# Patient Record
Sex: Male | Born: 1990 | Hispanic: Yes | Marital: Married
Health system: Southern US, Community
[De-identification: ages and names within clinical notes are randomized; demographics above are authoritative.]

---

## 2018-11-08 ENCOUNTER — Encounter (HOSPITAL_BASED_OUTPATIENT_CLINIC_OR_DEPARTMENT_OTHER): Payer: Self-pay | Admitting: Emergency Medicine

## 2018-11-08 ENCOUNTER — Other Ambulatory Visit: Payer: Self-pay

## 2018-11-08 ENCOUNTER — Emergency Department (HOSPITAL_BASED_OUTPATIENT_CLINIC_OR_DEPARTMENT_OTHER): Payer: Self-pay

## 2018-11-08 ENCOUNTER — Emergency Department (HOSPITAL_BASED_OUTPATIENT_CLINIC_OR_DEPARTMENT_OTHER)
Admission: EM | Admit: 2018-11-08 | Discharge: 2018-11-08 | Disposition: A | Discharge status: Home / Self Care | Payer: Self-pay | Attending: Emergency Medicine | Admitting: Emergency Medicine

## 2018-11-08 DIAGNOSIS — S60112A Contusion of left thumb with damage to nail, initial encounter: Secondary | ICD-10-CM | POA: Insufficient documentation

## 2018-11-08 DIAGNOSIS — Y9389 Activity, other specified: Secondary | ICD-10-CM | POA: Insufficient documentation

## 2018-11-08 DIAGNOSIS — Y9259 Other trade areas as the place of occurrence of the external cause: Secondary | ICD-10-CM | POA: Insufficient documentation

## 2018-11-08 DIAGNOSIS — S6010XA Contusion of unspecified finger with damage to nail, initial encounter: Secondary | ICD-10-CM

## 2018-11-08 DIAGNOSIS — Y990 Civilian activity done for income or pay: Secondary | ICD-10-CM | POA: Insufficient documentation

## 2018-11-08 DIAGNOSIS — Z23 Encounter for immunization: Secondary | ICD-10-CM | POA: Insufficient documentation

## 2018-11-08 DIAGNOSIS — W270XXA Contact with workbench tool, initial encounter: Secondary | ICD-10-CM | POA: Insufficient documentation

## 2018-11-08 MED ORDER — TETANUS-DIPHTH-ACELL PERTUSSIS 5-2.5-18.5 LF-MCG/0.5 IM SUSP
0.5000 mL | Freq: Once | INTRAMUSCULAR | Status: AC
  Administered 2018-11-08: 09:00:00 0.5 mL via INTRAMUSCULAR
  Filled 2018-11-08: qty 0.5

## 2018-11-08 MED ORDER — LIDOCAINE HCL 2 % IJ SOLN
10.0000 mL | Freq: Once | INTRAMUSCULAR | Status: AC
  Administered 2018-11-08: 10:00:00 200 mg
  Filled 2018-11-08: qty 20

## 2018-11-08 NOTE — Discharge Instructions (Addendum)
You were evaluated in the Emergency Department and after careful evaluation, we did not find any emergent condition requiring admission or further testing in the hospital.  Your symptoms today seem to be due to a collection of blood under the fingernail, which we drained here in the emergency department.  The hole in the fingernail should continue to drain.  Please use Tylenol or ibuprofen at home for discomfort.  Please return to the Emergency Department if you experience any worsening of your condition.  We encourage you to follow up with a primary care provider.  Thank you for allowing [place] to be a part of your care.

## 2018-11-08 NOTE — ED Provider Notes (Signed)
[facility] Emergency Department Provider Note MRN:  [id number]  Arrival date & time: 11/08/18     Chief Complaint   Finger Injury   History of Present Illness   Carl Ballard is a 28 y.o. year-old male with no pertinent past medical history presenting to the ED with chief complaint of finger injury.  Patient accidentally hit his left thumb with a hammer at work yesterday.  Injury occurred about 5 PM.  Patient has had continued pain to the thumb, which occasionally radiates up the left wrist.  Pain moderate to severe, kept him from sleeping last night.  Denies any other injuries, unsure of last tetanus shot.  Review of Systems  A complete 10 system review of systems was obtained and all systems are negative except as noted in the HPI and PMH.   Patient's Health History   History reviewed. No pertinent past medical history.  History reviewed. No pertinent surgical history.  History reviewed. No pertinent family history.  Social History   Socioeconomic History  . Marital status: Married    Spouse name: Not on file  . Number of children: Not on file  . Years of education: Not on file  . Highest education level: Not on file  Occupational History  . Not on file  Social Needs  . Financial resource strain: Not on file  . Food insecurity    Worry: Not on file    Inability: Not on file  . Transportation needs    Medical: Not on file    Non-medical: Not on file  Tobacco Use  . Smoking status: Unknown If Ever Smoked  Substance and Sexual Activity  . Alcohol use: Yes    Comment: occ  . Drug use: Not Currently  . Sexual activity: Yes  Lifestyle  . Physical activity    Days per week: Not on file    Minutes per session: Not on file  . Stress: Not on file  Relationships  . Social Musicianconnections    Talks on phone: Not on file    Gets together: Not on file    Attends religious service: Not on file    Active member of club or organization: Not on file     Attends meetings of clubs or organizations: Not on file    Relationship status: Not on file  . Intimate partner violence    Fear of current or ex partner: Not on file    Emotionally abused: Not on file    Physically abused: Not on file    Forced sexual activity: Not on file  Other Topics Concern  . Not on file  Social History Narrative  . Not on file     Physical Exam  Vital Signs and Nursing Notes reviewed Vitals:   11/08/18 0915  BP: 133/88  Pulse: 80  Resp: 20  Temp: 98 F (36.7 C)  SpO2: 100%    CONSTITUTIONAL: Well-appearing, NAD NEURO:  Alert and oriented x 3, no focal deficits EYES:  eyes equal and reactive ENT/NECK:  no LAD, no JVD CARDIO: Regular rate, well-perfused, normal S1 and S2 PULM:  CTAB no wheezing or rhonchi GI/GU:  normal bowel sounds, non-distended, non-tender MSK/SPINE:  No gross deformities, no edema SKIN: Subungual hematoma to the left thumb, nailbed intact, tenderness palpation to the distal left thumb, no snuffbox tenderness PSYCH:  Appropriate speech and behavior  Diagnostic and Interventional Summary    Labs Reviewed - No data to display  DG Hand Complete Left  Final Result      Medications  Tdap (BOOSTRIX) injection 0.5 mL (0.5 mLs Intramuscular Given 11/08/18 0929)  lidocaine (XYLOCAINE) 2 % (with pres) injection 200 mg (200 mg Other Given by Other 11/08/18 0932)     .Nerve Block  Date/Time: 11/08/2018 10:36 AM Performed by: Maudie Flakes, MD Authorized by: Maudie Flakes, MD   Consent:    Consent obtained:  Verbal   Consent given by:  Patient   Risks discussed:  Unsuccessful block and pain Indications:    Indications:  Procedural anesthesia Location:    Body area:  Upper extremity   Upper extremity nerve blocked: Left thumb. Pre-procedure details:    Skin preparation:  Alcohol   Preparation: Patient was prepped and draped in usual sterile fashion   Procedure details (see MAR for exact dosages):    Block needle gauge:   25 G   Anesthetic injected:  Lidocaine 2% w/o epi   Injection procedure:  Anatomic landmarks identified   Paresthesia:  None Post-procedure details:    Dressing:  None   Outcome:  Anesthesia achieved   Patient tolerance of procedure:  Tolerated well, no immediate complications .Marland KitchenIncision and Drainage  Date/Time: 11/08/2018 10:37 AM Performed by: Maudie Flakes, MD Authorized by: Maudie Flakes, MD   Consent:    Consent obtained:  Verbal   Consent given by:  Patient   Risks discussed:  Bleeding, damage to other organs, incomplete drainage and pain Location:    Type:  Subungual hematoma   Size:  40% of nail   Location: Left thumb. Pre-procedure details:    Skin preparation:  Chloraprep Anesthesia (see MAR for exact dosages):    Anesthesia method:  Nerve block Procedure type:    Complexity:  Simple Procedure details:    Incision types:  Stab incision (With cautery device)   Drainage:  Bloody   Drainage amount:  Moderate   Wound treatment:  Wound left open Post-procedure details:    Patient tolerance of procedure:  Tolerated well, no immediate complications   Critical Care  ED Course and Medical Decision Making  I have reviewed the triage vital signs and the nursing notes.  Pertinent labs & imaging results that were available during my care of the patient were reviewed by me and considered in my medical decision making (see below for details).  X-ray to exclude fracture, will consider trephination.  Neurovascularly intact distally.  X-ray reveals no acute fracture to the digit in question, question of chronic fracture to the first digit, which has no associated tenderness.  Trephination performed as described above with good drainage thereafter.  Appropriate for discharge.  After the discussed management above, the patient was determined to be safe for discharge.  The patient was in agreement with this plan and all questions regarding their care were answered.  ED return  precautions were discussed and the patient will return to the ED with any significant worsening of condition.  Barth Kirks. Sedonia Small, MD [place] [email or web address]  Final Clinical Impressions(s) / ED Diagnoses     ICD-10-CM   1. Subungual hematoma of digit of hand, initial encounter  S60.10XA     ED Discharge Orders    None         Maudie Flakes, MD 11/08/18 1039

## 2018-11-08 NOTE — ED Triage Notes (Addendum)
Pt had dropped something on left thumb yesterday at work and now has extreme pain radiating up arm. Swelling and bruising are evident. Interpreter services used for triage.

## 2018-12-25 ENCOUNTER — Emergency Department (HOSPITAL_BASED_OUTPATIENT_CLINIC_OR_DEPARTMENT_OTHER)
Admission: EM | Admit: 2018-12-25 | Discharge: 2018-12-25 | Disposition: A | Discharge status: Home / Self Care | Payer: Self-pay | Attending: Emergency Medicine | Admitting: Emergency Medicine

## 2018-12-25 ENCOUNTER — Encounter (HOSPITAL_BASED_OUTPATIENT_CLINIC_OR_DEPARTMENT_OTHER): Payer: Self-pay | Admitting: Emergency Medicine

## 2018-12-25 ENCOUNTER — Other Ambulatory Visit: Payer: Self-pay

## 2018-12-25 ENCOUNTER — Emergency Department (HOSPITAL_BASED_OUTPATIENT_CLINIC_OR_DEPARTMENT_OTHER): Payer: Self-pay

## 2018-12-25 DIAGNOSIS — Y9389 Activity, other specified: Secondary | ICD-10-CM | POA: Insufficient documentation

## 2018-12-25 DIAGNOSIS — Y998 Other external cause status: Secondary | ICD-10-CM | POA: Insufficient documentation

## 2018-12-25 DIAGNOSIS — S91311A Laceration without foreign body, right foot, initial encounter: Secondary | ICD-10-CM | POA: Insufficient documentation

## 2018-12-25 DIAGNOSIS — Y9289 Other specified places as the place of occurrence of the external cause: Secondary | ICD-10-CM | POA: Insufficient documentation

## 2018-12-25 DIAGNOSIS — W228XXA Striking against or struck by other objects, initial encounter: Secondary | ICD-10-CM | POA: Insufficient documentation

## 2018-12-25 MED ORDER — CEPHALEXIN 500 MG PO CAPS: 500.0000 mg | ORAL_CAPSULE | Freq: Four times a day (QID) | ORAL | 0 refills | Status: AC

## 2018-12-25 NOTE — ED Triage Notes (Signed)
[place]ed [place] rock, right plantar laceration , bleeding controlled obvious foot edema.

## 2018-12-25 NOTE — ED Provider Notes (Signed)
[facility] EMERGENCY DEPARTMENT Provider Note   CSN: [id number] Arrival date & time: 12/25/18  0808     History   Chief Complaint Chief Complaint  Patient presents with   Foot Injury    right    HPI Carl Ballard is a 28 y.o. male.     HPI [place].  Laceration the base of his right foot.  Jump was around 16 hours ago.  Has some bleeding.  Swelling.  No other injury.  He is otherwise healthy. History reviewed. No pertinent past medical history.  There are no active problems to display for this patient.   History reviewed. No pertinent surgical history.      Home Medications    Prior to Admission medications   Medication Sig Start Date End Date Taking? Authorizing Provider  cephALEXin (KEFLEX) 500 MG capsule Take 1 capsule (500 mg total) by mouth 4 (four) times daily. 12/25/18   Benjiman CorePickering, Kalyssa Anker, MD    Family History No family history on file.  Social History Social History   Tobacco Use   Smoking status: Unknown If Ever Smoked  Substance Use Topics   Alcohol use: Yes    Comment: occ   Drug use: Not Currently     Allergies   Patient has no known allergies.   Review of Systems Review of Systems  Constitutional: Negative for fever.  Cardiovascular: Negative for chest pain.  Musculoskeletal: Negative for back pain.       Right foot pain.  Skin: Positive for wound.  Neurological: Negative for weakness and numbness.     Physical Exam Updated Vital Signs BP 132/80 (BP Location: Right Arm)    Pulse 68    Temp 98.3 F (36.8 C) (Oral)    Resp 14    SpO2 100%   Physical Exam Vitals signs and nursing note reviewed.  HENT:     Head: Normocephalic.  Cardiovascular:     Rate and Rhythm: Regular rhythm.  Abdominal:     Palpations: Abdomen is soft.  Musculoskeletal:     Comments: Along mid sole of right foot there is approximately a 3 cm laceration.  Somewhat spaced.  Some surrounding bruising.  No purulent  drainage.  No clear bony tenderness.  No tenderness over ankle or more proximal long leg.  Skin:    General: Skin is warm.     Capillary Refill: Capillary refill takes less than 2 seconds.  Neurological:     Mental Status: Mental status is at baseline.      ED Treatments / Results  Labs (all labs ordered are listed, but only abnormal results are displayed) Labs Reviewed - No data to display  EKG None  Radiology Dg Foot Complete Right  Result Date: 12/25/2018 CLINICAL DATA:  Laceration after fall. EXAM: RIGHT FOOT COMPLETE - 3+ VIEW COMPARISON:  None. FINDINGS: Negative for fracture or dislocation. Normal alignment of the right foot. No significant soft tissue abnormality based on the radiographs. IMPRESSION: Negative radiographs of the right foot. Electronically Signed   By: Richarda OverlieAdam  Henn M.D.   On: 12/25/2018 09:02    Procedures Procedures (including critical care time)  Medications Ordered in ED Medications - No data to display   Initial Impression / Assessment and Plan / ED Course  I have reviewed the triage vital signs and the nursing notes.  Pertinent labs & imaging results that were available during my care of the patient were reviewed by me and considered in my  medical decision making (see chart for details).        Patient laceration to foot.  Occurred around 16 hours ago.  Will give antibiotics.  Do not think good closure right now since it is potentially somewhat contaminated and is somewhat spread do not think would close well.  Antibiotics will be given.  Discharge home  Final Clinical Impressions(s) / ED Diagnoses   Final diagnoses:  Laceration of right foot, initial encounter    ED Discharge Orders         Ordered    cephALEXin (KEFLEX) 500 MG capsule  4 times daily     12/25/18 Traver, Mekayla Soman, MD 12/25/18 [phone]

## 2018-12-25 NOTE — Discharge Instructions (Addendum)
Watch for signs of infection.  Keep the wound clean.  Follow-up with your primary care as needed.

## 2021-02-10 IMAGING — DX DG FOOT COMPLETE 3+V*R*
3 series · 3 of 3 positions shown · non-contrast
Comparison: None.

CLINICAL DATA: Laceration after fall.

EXAM:
RIGHT FOOT COMPLETE - 3+ VIEW

[foot ap]
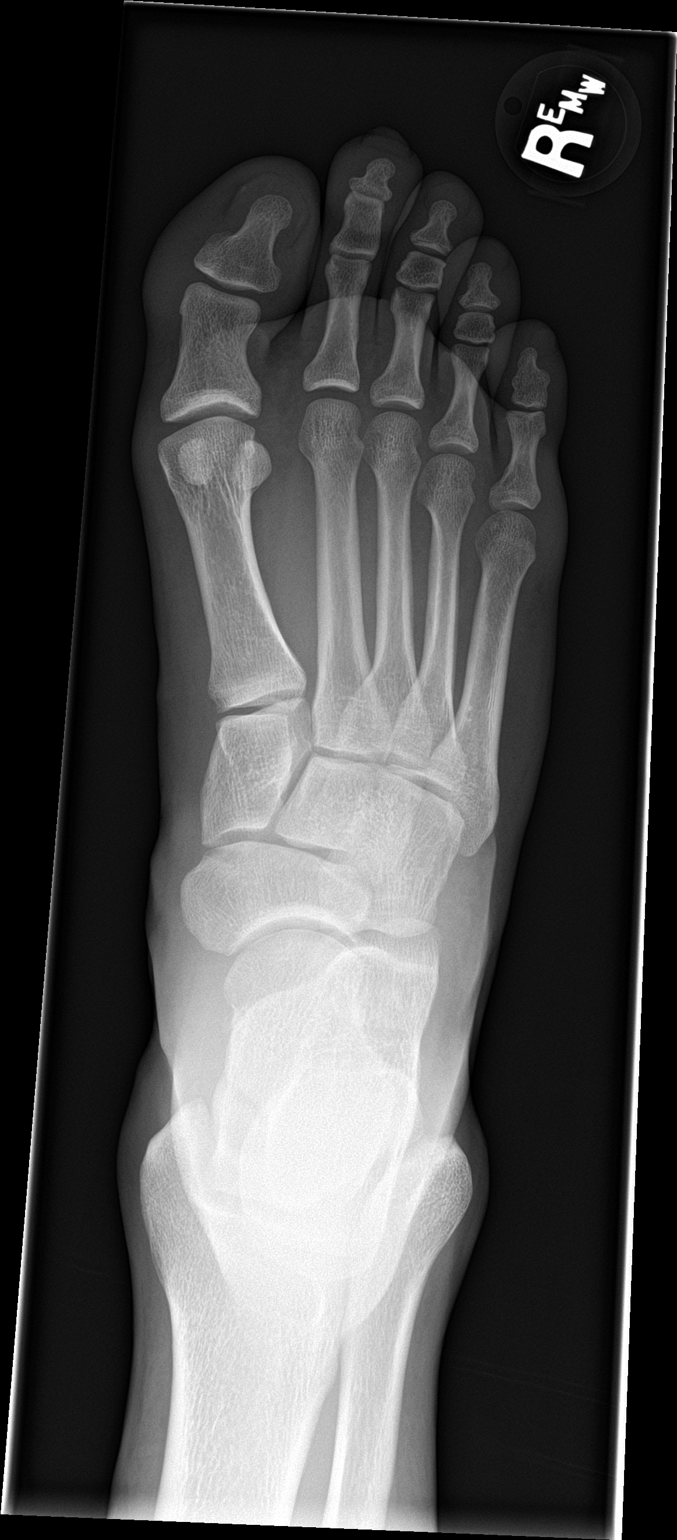

[foot obl]
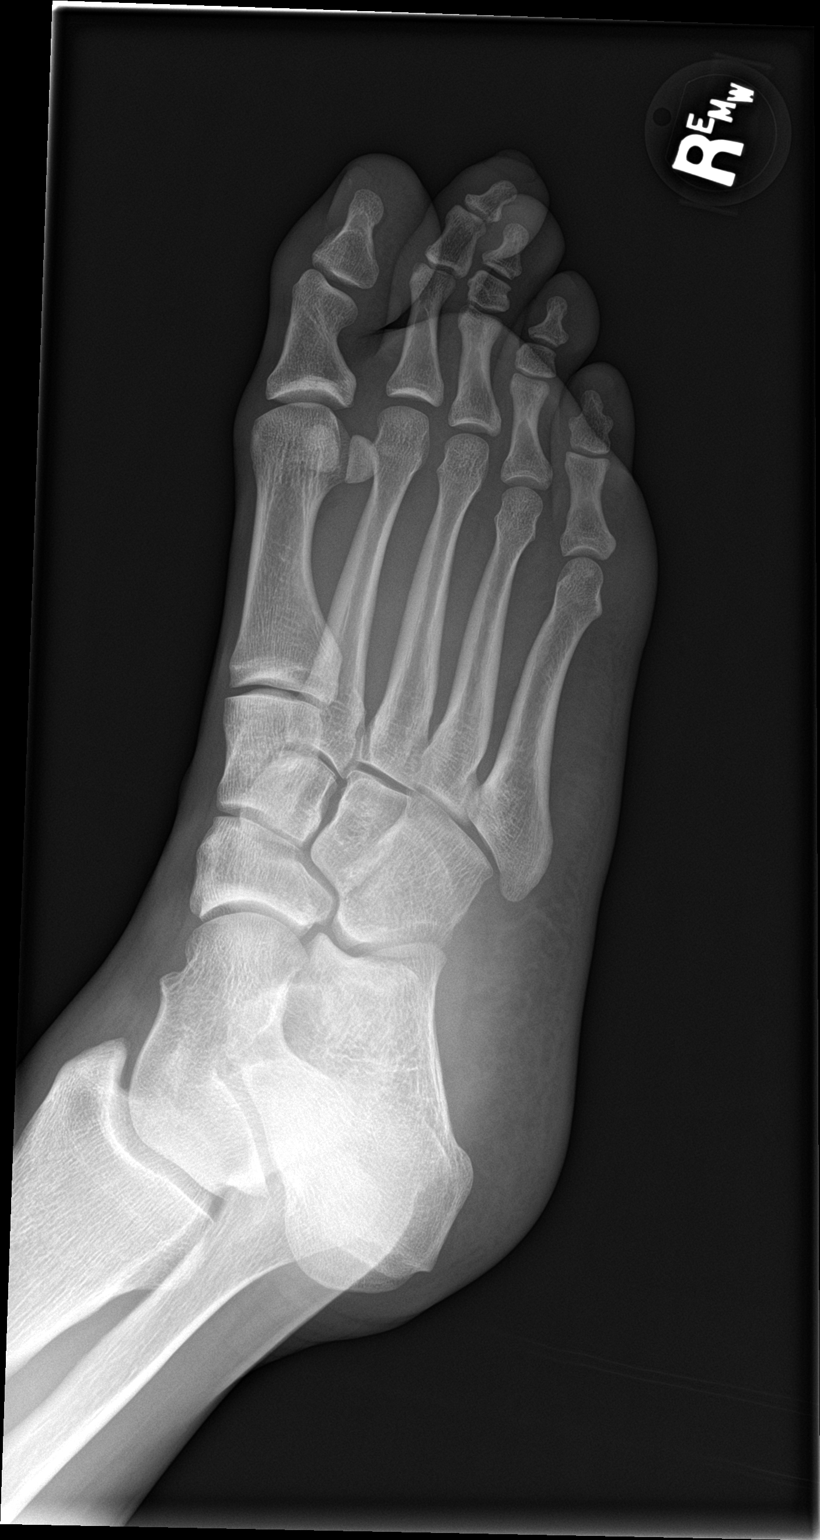

[foot lat]
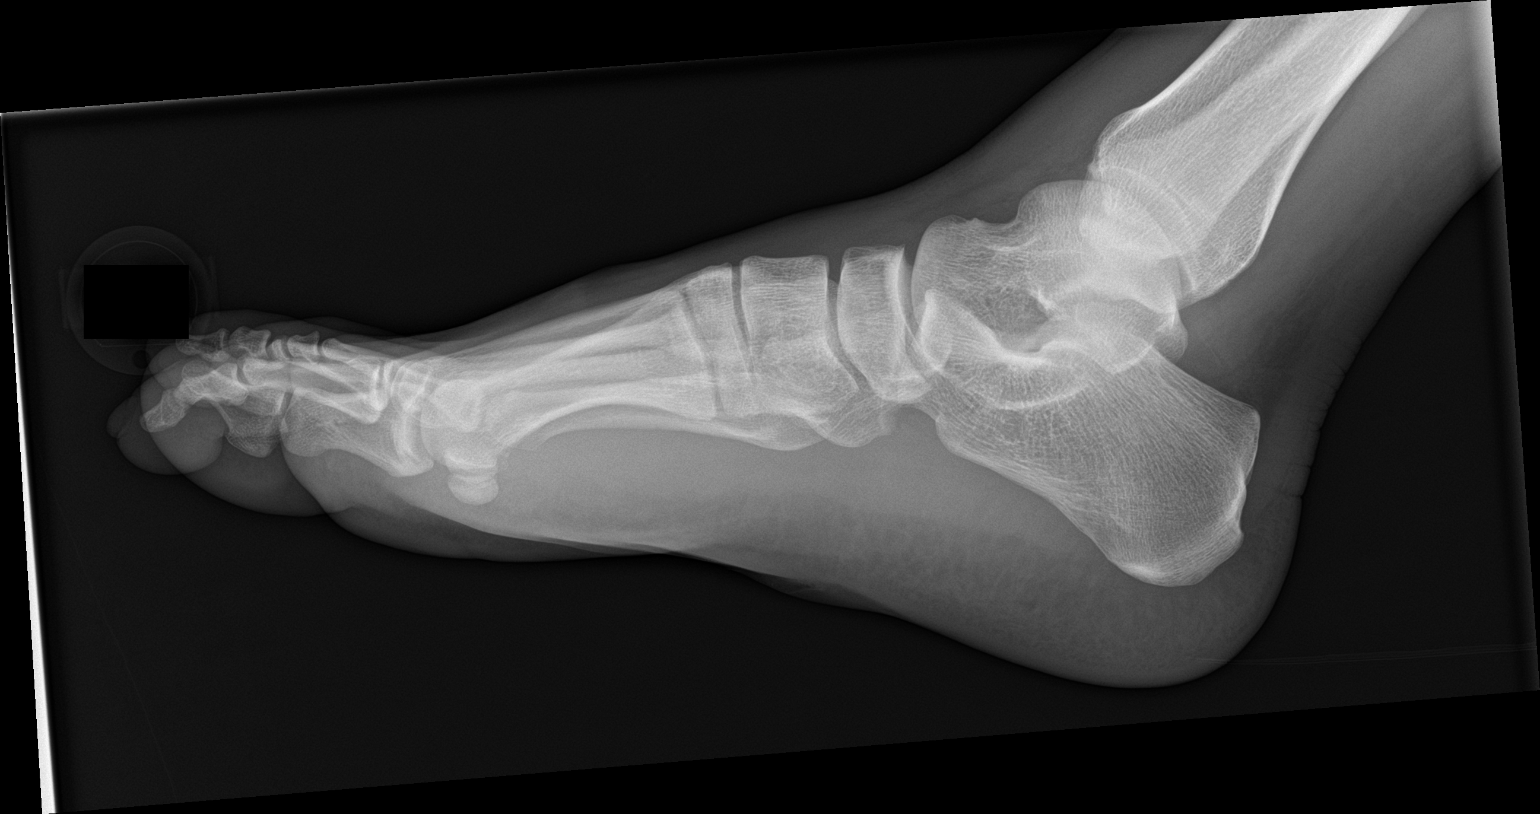

[3 of 3 positions shown; findings below may reference images not displayed]

FINDINGS: Negative for fracture or dislocation. Normal alignment of the right
foot. No significant soft tissue abnormality based on the
radiographs.
IMPRESSION: Negative radiographs of the right foot.
